# Patient Record
Sex: Female | Born: 1940 | Race: White | Hispanic: No | Smoking: Never smoker
Health system: Southern US, Community
[De-identification: ages and names within clinical notes are randomized; demographics above are authoritative.]

## PROBLEM LIST (undated history)

## (undated) DIAGNOSIS — B029 Zoster without complications: Secondary | ICD-10-CM

## (undated) DIAGNOSIS — I8390 Asymptomatic varicose veins of unspecified lower extremity: Secondary | ICD-10-CM

## (undated) DIAGNOSIS — D649 Anemia, unspecified: Secondary | ICD-10-CM

## (undated) DIAGNOSIS — IMO0002 Reserved for concepts with insufficient information to code with codable children: Secondary | ICD-10-CM

## (undated) HISTORY — DX: Reserved for concepts with insufficient information to code with codable children: IMO0002

## (undated) HISTORY — PX: MOUTH SURGERY: SHX715

## (undated) HISTORY — PX: BREAST BIOPSY: SHX20

## (undated) HISTORY — PX: APPENDECTOMY: SHX54

## (undated) HISTORY — PX: ABDOMINAL HYSTERECTOMY: SHX81

## (undated) HISTORY — DX: Asymptomatic varicose veins of unspecified lower extremity: I83.90

## (undated) HISTORY — DX: Anemia, unspecified: D64.9

## (undated) HISTORY — DX: Zoster without complications: B02.9

---

## 1999-02-16 ENCOUNTER — Ambulatory Visit (HOSPITAL_COMMUNITY): Admission: RE | Admit: 1999-02-16 | Discharge: 1999-02-16 | Payer: Self-pay | Admitting: General Surgery

## 1999-02-16 ENCOUNTER — Encounter: Payer: Self-pay | Admitting: General Surgery

## 1999-09-28 ENCOUNTER — Encounter: Admission: RE | Admit: 1999-09-28 | Discharge: 1999-09-28 | Payer: Self-pay | Admitting: General Surgery

## 2005-11-19 ENCOUNTER — Emergency Department (HOSPITAL_COMMUNITY): Admission: EM | Admit: 2005-11-19 | Discharge: 2005-11-19 | Payer: Self-pay | Admitting: Emergency Medicine

## 2007-05-16 ENCOUNTER — Encounter: Admission: RE | Admit: 2007-05-16 | Discharge: 2007-05-16 | Payer: Self-pay | Admitting: Internal Medicine

## 2008-05-21 ENCOUNTER — Encounter: Admission: RE | Admit: 2008-05-21 | Discharge: 2008-05-21 | Payer: Self-pay | Admitting: Internal Medicine

## 2009-05-12 ENCOUNTER — Observation Stay (HOSPITAL_COMMUNITY): Admission: EM | Admit: 2009-05-12 | Discharge: 2009-05-12 | Payer: Self-pay | Admitting: Emergency Medicine

## 2009-06-25 ENCOUNTER — Encounter: Admission: RE | Admit: 2009-06-25 | Discharge: 2009-06-25 | Payer: Self-pay | Admitting: Internal Medicine

## 2010-06-29 ENCOUNTER — Encounter: Admission: RE | Admit: 2010-06-29 | Discharge: 2010-06-29 | Payer: Self-pay | Admitting: Internal Medicine

## 2011-03-23 LAB — POCT I-STAT, CHEM 8
Calcium, Ion: 1.07 mmol/L — ABNORMAL LOW (ref 1.12–1.32)
Creatinine, Ser: 0.8 mg/dL (ref 0.4–1.2)
Glucose, Bld: 161 mg/dL — ABNORMAL HIGH (ref 70–99)
HCT: 38 % (ref 36.0–46.0)
Hemoglobin: 12.9 g/dL (ref 12.0–15.0)

## 2011-03-23 LAB — HEPATIC FUNCTION PANEL
ALT: 25 U/L (ref 0–35)
AST: 34 U/L (ref 0–37)
Bilirubin, Direct: 0.2 mg/dL (ref 0.0–0.3)
Indirect Bilirubin: 0.6 mg/dL (ref 0.3–0.9)
Total Bilirubin: 0.8 mg/dL (ref 0.3–1.2)

## 2011-07-01 ENCOUNTER — Other Ambulatory Visit: Payer: Self-pay | Admitting: Internal Medicine

## 2011-07-01 DIAGNOSIS — Z1231 Encounter for screening mammogram for malignant neoplasm of breast: Secondary | ICD-10-CM

## 2011-07-28 ENCOUNTER — Ambulatory Visit
Admission: RE | Admit: 2011-07-28 | Discharge: 2011-07-28 | Disposition: A | Discharge status: Home / Self Care | Payer: Medicare Other | Source: Ambulatory Visit | Attending: Internal Medicine | Admitting: Internal Medicine

## 2011-07-28 DIAGNOSIS — Z1231 Encounter for screening mammogram for malignant neoplasm of breast: Secondary | ICD-10-CM

## 2011-10-07 ENCOUNTER — Other Ambulatory Visit: Payer: Self-pay | Admitting: Internal Medicine

## 2011-10-07 DIAGNOSIS — M8580 Other specified disorders of bone density and structure, unspecified site: Secondary | ICD-10-CM

## 2011-10-20 ENCOUNTER — Ambulatory Visit
Admission: RE | Admit: 2011-10-20 | Discharge: 2011-10-20 | Disposition: A | Discharge status: Home / Self Care | Payer: Medicare Other | Source: Ambulatory Visit | Attending: Internal Medicine | Admitting: Internal Medicine

## 2011-10-20 DIAGNOSIS — M8580 Other specified disorders of bone density and structure, unspecified site: Secondary | ICD-10-CM

## 2012-07-21 ENCOUNTER — Other Ambulatory Visit: Payer: Self-pay | Admitting: Internal Medicine

## 2012-07-21 DIAGNOSIS — Z1231 Encounter for screening mammogram for malignant neoplasm of breast: Secondary | ICD-10-CM

## 2012-08-15 ENCOUNTER — Ambulatory Visit
Admission: RE | Admit: 2012-08-15 | Discharge: 2012-08-15 | Disposition: A | Discharge status: Home / Self Care | Payer: Medicare Other | Source: Ambulatory Visit | Attending: Internal Medicine | Admitting: Internal Medicine

## 2012-08-15 DIAGNOSIS — Z1231 Encounter for screening mammogram for malignant neoplasm of breast: Secondary | ICD-10-CM

## 2012-08-17 ENCOUNTER — Other Ambulatory Visit: Payer: Self-pay | Admitting: Internal Medicine

## 2012-08-17 DIAGNOSIS — R928 Other abnormal and inconclusive findings on diagnostic imaging of breast: Secondary | ICD-10-CM

## 2012-08-23 ENCOUNTER — Ambulatory Visit
Admission: RE | Admit: 2012-08-23 | Discharge: 2012-08-23 | Disposition: A | Discharge status: Home / Self Care | Payer: Medicare Other | Source: Ambulatory Visit | Attending: Internal Medicine | Admitting: Internal Medicine

## 2012-08-23 ENCOUNTER — Other Ambulatory Visit: Payer: Self-pay | Admitting: Internal Medicine

## 2012-08-23 DIAGNOSIS — R928 Other abnormal and inconclusive findings on diagnostic imaging of breast: Secondary | ICD-10-CM

## 2012-08-23 DIAGNOSIS — R921 Mammographic calcification found on diagnostic imaging of breast: Secondary | ICD-10-CM

## 2012-09-04 ENCOUNTER — Other Ambulatory Visit: Payer: Self-pay | Admitting: Internal Medicine

## 2012-09-04 ENCOUNTER — Ambulatory Visit
Admission: RE | Admit: 2012-09-04 | Discharge: 2012-09-04 | Disposition: A | Discharge status: Home / Self Care | Payer: Medicare Other | Source: Ambulatory Visit | Attending: Internal Medicine | Admitting: Internal Medicine

## 2012-09-04 DIAGNOSIS — R921 Mammographic calcification found on diagnostic imaging of breast: Secondary | ICD-10-CM

## 2013-11-02 ENCOUNTER — Other Ambulatory Visit: Payer: Self-pay | Admitting: Vascular Surgery

## 2013-11-02 DIAGNOSIS — L97909 Non-pressure chronic ulcer of unspecified part of unspecified lower leg with unspecified severity: Secondary | ICD-10-CM

## 2013-12-03 ENCOUNTER — Encounter: Payer: Self-pay | Admitting: Vascular Surgery

## 2013-12-04 ENCOUNTER — Encounter: Payer: Self-pay | Admitting: Vascular Surgery

## 2013-12-04 ENCOUNTER — Ambulatory Visit (HOSPITAL_COMMUNITY)
Admission: RE | Admit: 2013-12-04 | Discharge: 2013-12-04 | Disposition: A | Discharge status: Home / Self Care | Payer: Medicare Other | Source: Ambulatory Visit | Attending: Vascular Surgery | Admitting: Vascular Surgery

## 2013-12-04 ENCOUNTER — Other Ambulatory Visit: Payer: Self-pay | Admitting: Vascular Surgery

## 2013-12-04 ENCOUNTER — Ambulatory Visit (INDEPENDENT_AMBULATORY_CARE_PROVIDER_SITE_OTHER): Payer: Self-pay | Admitting: Vascular Surgery

## 2013-12-04 VITALS — BP 170/81 | HR 71 | Ht 62.5 in | Wt 175.0 lb

## 2013-12-04 DIAGNOSIS — L97909 Non-pressure chronic ulcer of unspecified part of unspecified lower leg with unspecified severity: Secondary | ICD-10-CM

## 2013-12-04 DIAGNOSIS — I8390 Asymptomatic varicose veins of unspecified lower extremity: Secondary | ICD-10-CM | POA: Insufficient documentation

## 2013-12-04 DIAGNOSIS — IMO0001 Reserved for inherently not codable concepts without codable children: Secondary | ICD-10-CM

## 2013-12-04 DIAGNOSIS — I83009 Varicose veins of unspecified lower extremity with ulcer of unspecified site: Secondary | ICD-10-CM | POA: Insufficient documentation

## 2013-12-04 NOTE — Progress Notes (Signed)
Patient name: Carmen Delgado MRN: [id number] DOB: 1941-02-04 Sex: female   Referred by: Ludwig Clarks  Reason for referral:  Chief Complaint  Patient presents with  . New Evaluation    R leg cellulitis/ venous stasis ulcer     HISTORY OF PRESENT ILLNESS: Patient is a 72 year old white female with long history of severe venous hypertension. He has had extensive changes of the stasis disease for many years to include extensive hemosiderin deposits and varicosities. She's had multiple prior superficial bleeds from the telangiectasia over her lower extremities below the knees as well. She had an episode of ulceration over the right pretibial area several months ago and this is been progressive since that time. She's had a great deal of induration and severe pain associated with this. She has had inability to allow any pressure over this reports that she attempt to keep this open tears much as possible to make it feel better. She does not have any history of DVT. She does have a very strong family history of extensive venous pathology especially in her mother. She has never had any venous stasis ulcers on her left leg and this is the first venous ulcer on her right leg. He stands for a prolonged period of time and she works as a Copy.  Past Medical History  Diagnosis Date  . Anemia     Past Surgical History  Procedure Laterality Date  . Abdominal hysterectomy    . Appendectomy    . Mouth surgery      fatty tumor removed    History   Social History  . Marital Status: Divorced    Spouse Name: N/A    Number of Children: N/A  . Years of Education: N/A   Occupational History  . Not on file.   Social History Main Topics  . Smoking status: Former Smoker    Types: Cigarettes  . Smokeless tobacco: Not on file     Comment: smoked cigs for a short time in 20's   . Alcohol Use: No  . Drug Use: No  . Sexual Activity: Not on file   Other Topics Concern  . Not on  file   Social History Narrative  . No narrative on file    Family History  Problem Relation Age of Onset  . Diabetes Mother   . Heart disease Mother     before age 76  . Hypertension Mother   . Varicose Veins Mother   . Peripheral vascular disease Mother   . Heart disease Father     before age 72  . Hypertension Father   . Cancer Sister   . Diabetes Sister   . Hypertension Sister   . Heart disease Sister     before age 49  . Diabetes Brother   . Hypertension Brother   . Diabetes Daughter     Allergies as of 12/04/2013 - Review Complete 12/04/2013  Allergen Reaction Noted  . Codeine Nausea And Vomiting 12/04/2013  . Sulfa antibiotics Nausea And Vomiting 12/04/2013  . Garlic Rash 12/04/2013    No current outpatient prescriptions on file prior to visit.   No current facility-administered medications on file prior to visit.     REVIEW OF SYSTEMS:  Positives indicated with an "X"  CARDIOVASCULAR:  [ ]  chest pain   [ ]  chest pressure   [ ]  palpitations   [ ]  orthopnea   [ ]  dyspnea on exertion   [ ]  claudication   [ ]   rest pain   [ ]  DVT   [ ]  phlebitis PULMONARY:   [ ]  productive cough   [ ]  asthma   [ ]  wheezing NEUROLOGIC:   [ ]  weakness  [ ]  paresthesias  [ ]  aphasia  [ ]  amaurosis  [ ]  dizziness HEMATOLOGIC:   [ ]  bleeding problems   [ ]  clotting disorders MUSCULOSKELETAL:  [ ]  joint pain   [ ]  joint swelling GASTROINTESTINAL: [ ]   blood in stool  [ ]   hematemesis GENITOURINARY:  [ ]   dysuria  [ ]   hematuria PSYCHIATRIC:  [ ]  history of major depression INTEGUMENTARY:  [ ]  rashes  [ ]  ulcers CONSTITUTIONAL:  [ ]  fever   [ ]  chills  PHYSICAL EXAMINATION:  General: The patient is a well-nourished female, in no acute distress. Vital signs are BP 170/81  Pulse 71  Ht 5' 2.5" (1.588 m)  Wt 175 lb (79.379 kg)  BMI 31.48 kg/m2  SpO2 100% Pulmonary: There is a good air exchange bilaterally without wheezing or rales. Abdomen: Soft and non-tender with normal  pitch bowel sounds. Musculoskeletal: There are no major deformities.   Neurologic: No focal weakness or paresthesias are detected, Skin: Extensive changes of venous hypertension bilaterally with circumferential hemosiderin deposits from just below her knees onto her ankle. She has extensive telangiectasia over both feet as well. She does have a 2 cm open venous ulcer over the Psychiatric: The patient has normal affect. Cardiovascular: There is a regular rate and rhythm without significant murmur appreciated. She has 2+ dorsalis pedis pulses bilaterally  [facility] Vascular Lab Studies:  Ordered and Independently Reviewed this reveals significant reflux in both great saphenous veins with dilated saphenous vein bilaterally. She does have deep venous reflux as well.  Impression and Plan:  Severe bilateral venous reflux with right leg venous stasis ulcer. I had discussed the critical importance of compression and elevation with the patient. He has a very difficult time with this the 2 pain. I do feel that she benefit with ablation of her great saphenous vein bilaterally. I feel this would have a very good chance of improving her healing for her very painful venous ulcer. We have instructed her on dressing and a venous ulcer with Silvadene and a Ace wrap for compression. I would recommend proceeding with right leg ablation to improve her chances for healing. We fitted her for bilateral thigh-high graduated compression garments, 20-30 mm mercury today. I would recommend treatment of her right great saphenous vein now versus waiting for a 3 month trial due to her severe pain and open venous ulcer the    EARLY, TODD [facility] Office: [phone]

## 2013-12-05 ENCOUNTER — Encounter: Payer: Self-pay | Admitting: Internal Medicine

## 2013-12-07 ENCOUNTER — Encounter: Payer: Medicare Other | Admitting: Vascular Surgery

## 2013-12-07 ENCOUNTER — Other Ambulatory Visit (HOSPITAL_COMMUNITY): Payer: Medicare Other

## 2013-12-21 ENCOUNTER — Other Ambulatory Visit: Payer: Self-pay | Admitting: *Deleted

## 2013-12-21 DIAGNOSIS — L97919 Non-pressure chronic ulcer of unspecified part of right lower leg with unspecified severity: Principal | ICD-10-CM

## 2013-12-21 DIAGNOSIS — I83019 Varicose veins of right lower extremity with ulcer of unspecified site: Secondary | ICD-10-CM

## 2013-12-27 ENCOUNTER — Other Ambulatory Visit: Payer: Self-pay | Admitting: Vascular Surgery

## 2013-12-27 DIAGNOSIS — I83019 Varicose veins of right lower extremity with ulcer of unspecified site: Secondary | ICD-10-CM

## 2013-12-27 DIAGNOSIS — L97919 Non-pressure chronic ulcer of unspecified part of right lower leg with unspecified severity: Principal | ICD-10-CM

## 2014-01-16 ENCOUNTER — Encounter: Payer: Self-pay | Admitting: Vascular Surgery

## 2014-01-17 ENCOUNTER — Encounter: Payer: Self-pay | Admitting: Vascular Surgery

## 2014-01-17 ENCOUNTER — Ambulatory Visit (INDEPENDENT_AMBULATORY_CARE_PROVIDER_SITE_OTHER): Payer: Medicare Other | Admitting: Vascular Surgery

## 2014-01-17 VITALS — BP 115/73 | HR 102 | Resp 18 | Ht 62.0 in | Wt 180.0 lb

## 2014-01-17 DIAGNOSIS — L97909 Non-pressure chronic ulcer of unspecified part of unspecified lower leg with unspecified severity: Principal | ICD-10-CM

## 2014-01-17 DIAGNOSIS — I83009 Varicose veins of unspecified lower extremity with ulcer of unspecified site: Secondary | ICD-10-CM | POA: Insufficient documentation

## 2014-01-17 HISTORY — PX: ENDOVENOUS ABLATION SAPHENOUS VEIN W/ LASER: SUR449

## 2014-01-17 NOTE — Progress Notes (Signed)
   Laser Ablation Procedure      Date: 01/17/2014    Burr Medicolender F Ide DOB:10/19/1941  Consent signed: Yes  Surgeon:T.F. Caprina Wussow  Procedure: Laser Ablation: right Greater Saphenous Vein  BP 115/73  Pulse 102  Resp 18  Ht 5\' 2"  (1.575 m)  Wt 180 lb (81.647 kg)  BMI 32.91 kg/m2  Start time: 8:30am   End time: 9:50am  Tumescent Anesthesia: 475 cc 0.9% NaCl with 50 cc Lidocaine HCL with 1% Epi and 15 cc 8.4% NaHCO3  Local Anesthesia: 4.5 cc Lidocaine HCL and NaHCO3 (ratio 2:1)  Continuous Mode: 15 Watts Total Energy 2386 Joules Total Time2:38   Sclerotherapy: 0.3 %Sotradecol. Patient received a total of 3 cc  Right leg    Patient tolerated procedure well: Yes   Description of Procedure:  After marking the course of the saphenous vein and the secondary varicosities in the standing position, the patient was placed on the operating table in the supine position, and the right leg was prepped and draped in sterile fashion. Local anesthetic was administered, and under ultrasound guidance the saphenous vein was accessed with a micro needle and guide wire; then the micro puncture sheath was placed. A guide wire was inserted to the saphenofemoral junction, followed by a 5 french sheath.  The position of the sheath and then the laser fiber below the junction was confirmed using the ultrasound and visualization of the aiming beam.  Tumescent anesthesia was administered along the course of the saphenous vein using ultrasound guidance. Protective laser glasses were placed on the patient, and the laser was fired at at 15 watt continuous mode.  For a total of 2386 joules.  A steri strip was applied to the puncture site.    Sclerotherapy was performed to 3 vessels using 3  cc .3% Sotradecol foam via a 30 gauge needle.  ABD pads and thigh high compression stockings were applied.  Ace wrap bandages were applied  at the top of the saphenofemoral junction.  Blood loss was less than 15 cc.  The patient  ambulated out of the operating room having tolerated the procedure well.

## 2014-01-18 ENCOUNTER — Telehealth: Payer: Self-pay | Admitting: *Deleted

## 2014-01-18 ENCOUNTER — Encounter: Payer: Self-pay | Admitting: Vascular Surgery

## 2014-01-18 NOTE — Telephone Encounter (Signed)
    01/18/2014  Time: 11:43 AM   Patient Name: Carmen Delgado  Patient of: T.F. Early  Procedure:Laser Ablation right greater saphenous vein and sclerotherapy right leg 01-17-2014  Reached patient at home and checked  Her status  Yes    Comments/Actions Taken: Ms. Larey BrickWinfree states she has generalized right leg pain which she describes as "dull, achy" but no right leg swelling.  She states that she is taking Ibuprofen as instructed and keeping her right leg elevated and using ice compress to right leg prn pain.  Reviewed post procedural instructions with her and reminded her of post laser ablation duplex and VV FU with Dr. Arbie CookeyEarly on 01-22-2014.     @SIGNATURE @

## 2014-01-21 ENCOUNTER — Encounter: Payer: Self-pay | Admitting: Vascular Surgery

## 2014-01-22 ENCOUNTER — Encounter: Payer: Self-pay | Admitting: Vascular Surgery

## 2014-01-22 ENCOUNTER — Ambulatory Visit (HOSPITAL_COMMUNITY)
Admission: RE | Admit: 2014-01-22 | Discharge: 2014-01-22 | Disposition: A | Discharge status: Home / Self Care | Payer: Medicare Other | Source: Ambulatory Visit | Attending: Vascular Surgery | Admitting: Vascular Surgery

## 2014-01-22 ENCOUNTER — Ambulatory Visit (INDEPENDENT_AMBULATORY_CARE_PROVIDER_SITE_OTHER): Payer: Medicare Other | Admitting: Vascular Surgery

## 2014-01-22 VITALS — BP 140/79 | HR 91 | Resp 18 | Ht 62.0 in | Wt 179.3 lb

## 2014-01-22 DIAGNOSIS — I83009 Varicose veins of unspecified lower extremity with ulcer of unspecified site: Secondary | ICD-10-CM

## 2014-01-22 DIAGNOSIS — L97919 Non-pressure chronic ulcer of unspecified part of right lower leg with unspecified severity: Secondary | ICD-10-CM

## 2014-01-22 DIAGNOSIS — L97909 Non-pressure chronic ulcer of unspecified part of unspecified lower leg with unspecified severity: Principal | ICD-10-CM

## 2014-01-22 DIAGNOSIS — I83019 Varicose veins of right lower extremity with ulcer of unspecified site: Secondary | ICD-10-CM

## 2014-01-22 DIAGNOSIS — Z09 Encounter for follow-up examination after completed treatment for conditions other than malignant neoplasm: Secondary | ICD-10-CM | POA: Insufficient documentation

## 2014-01-22 NOTE — Progress Notes (Signed)
Here today for followup of her laser ablation of right great saphenous vein one week ago. She had minimal discomfort associated with this. He fortunately persistent soreness around her ankle where she had an extensive venous ulcer. She is mild soreness in her medial thigh at the ablation site.  Past Medical History  Diagnosis Date  . Anemia   . Ulcer   . Varicose veins     History  Substance Use Topics  . Smoking status: Never Smoker   . Smokeless tobacco: Not on file  . Alcohol Use: No    Family History  Problem Relation Age of Onset  . Diabetes Mother   . Heart disease Mother     before age 660  . Hypertension Mother   . Varicose Veins Mother   . Peripheral vascular disease Mother   . Heart disease Father     before age 74  . Hypertension Father   . Cancer Sister   . Diabetes Sister   . Hypertension Sister   . Heart disease Sister     before age 74  . Diabetes Brother   . Hypertension Brother   . Diabetes Daughter     Allergies  Allergen Reactions  . Codeine Nausea And Vomiting  . Sulfa Antibiotics Nausea And Vomiting  . Garlic Rash    Current outpatient prescriptions:Calcium Citrate (CITRACAL PO), Take 1 capsule by mouth daily., Disp: , Rfl: ;  Cholecalciferol (VITAMIN D3) 1000 UNITS CAPS, Take 1 capsule by mouth daily., Disp: , Rfl: ;  levothyroxine (SYNTHROID, LEVOTHROID) 50 MCG tablet, Take 50 mcg by mouth daily before breakfast., Disp: , Rfl: ;  lisinopril-hydrochlorothiazide (PRINZIDE,ZESTORETIC) 20-25 MG per tablet, Take 1 tablet by mouth daily., Disp: , Rfl:  magnesium gluconate (MAGONATE) 500 MG tablet, Take 500 mg by mouth 2 (two) times daily., Disp: , Rfl: ;  Neomycin-Bacitracin-Polymyxin (NEOSPORIN EX), Apply 1 application topically as needed., Disp: , Rfl: ;  pravastatin (PRAVACHOL) 40 MG tablet, Take 40 mg by mouth daily., Disp: , Rfl:   BP 140/79  Pulse 91  Resp 18  Ht 5\' 2"  (1.575 m)  Wt 179 lb 4.8 oz (81.33 kg)  BMI 32.79 kg/m2  Body mass index  is 32.79 kg/(m^2).       On physical exam she does have some mild bruising to her right medial thigh. She does not have any evidence of superficial thrombophlebitis. The area of sclerotherapy of the telangiectasia is all healing with typical darkening of the telangiectasia.  Venous duplex today was reviewed with the patient. This shows closure of her saphenous vein from the insertion site just below the knee to just below the saphenofemoral junction. There is no evidence of DVT.  Impression and plan: Treatment of venous hypertension related to the surface vein the right leg. She will continue her compression garment used for one additional week and then as needed. We will see her back for a similar treatment of her ablation of her left great saphenous vein in 2 weeks.

## 2014-02-06 ENCOUNTER — Encounter: Payer: Self-pay | Admitting: Vascular Surgery

## 2014-02-07 ENCOUNTER — Other Ambulatory Visit: Payer: Medicare Other | Admitting: Vascular Surgery

## 2014-02-12 ENCOUNTER — Ambulatory Visit: Payer: Medicare Other | Admitting: Vascular Surgery

## 2014-02-12 ENCOUNTER — Encounter (HOSPITAL_COMMUNITY): Payer: Medicare Other

## 2014-02-13 ENCOUNTER — Other Ambulatory Visit: Payer: Self-pay | Admitting: *Deleted

## 2014-02-13 DIAGNOSIS — M79609 Pain in unspecified limb: Secondary | ICD-10-CM

## 2014-02-13 DIAGNOSIS — I83893 Varicose veins of bilateral lower extremities with other complications: Secondary | ICD-10-CM

## 2014-03-07 ENCOUNTER — Encounter: Payer: Self-pay | Admitting: Vascular Surgery

## 2014-03-08 ENCOUNTER — Encounter: Payer: Self-pay | Admitting: Vascular Surgery

## 2014-03-08 ENCOUNTER — Ambulatory Visit (INDEPENDENT_AMBULATORY_CARE_PROVIDER_SITE_OTHER): Payer: Self-pay | Admitting: Vascular Surgery

## 2014-03-08 VITALS — BP 149/75 | HR 76 | Resp 18 | Ht 62.0 in | Wt 178.0 lb

## 2014-03-08 DIAGNOSIS — I83893 Varicose veins of bilateral lower extremities with other complications: Secondary | ICD-10-CM

## 2014-03-08 HISTORY — PX: ENDOVENOUS ABLATION SAPHENOUS VEIN W/ LASER: SUR449

## 2014-03-08 NOTE — Progress Notes (Signed)
   Laser Ablation Procedure      Date: 03/08/2014    Burr Medicolender F Salazar DOB:09-Mar-1941  Consent signed: Yes  Surgeon:T.F. Cherlynn Popiel  Procedure: Laser Ablation: left Greater Saphenous Vein  BP 149/75  Pulse 76  Resp 18  Ht 5\' 2"  (1.575 m)  Wt 178 lb (80.74 kg)  BMI 32.55 kg/m2  Start time: 10:30 AM   End time: 11:45 AM  Tumescent Anesthesia: 425 cc 0.9% NaCl with 50 cc Lidocaine HCL with 1% Epi and 15 cc 8.4% NaHCO3  Local Anesthesia: 4 cc Lidocaine HCL and NaHCO3 (ratio 2:1)  Continuous Mode: 15 Watts Total Energy 2383 Joules Total Time2:39   Sclerotherapy: 0.3 %Sotradecol. Patient received a total of 3 cc   Left leg    Patient tolerated procedure well: Yes   Description of Procedure:  After marking the course of the saphenous vein and the secondary varicosities in the standing position, the patient was placed on the operating table in the supine position, and the left leg was prepped and draped in sterile fashion. Local anesthetic was administered, and under ultrasound guidance the saphenous vein was accessed with a micro needle and guide wire; then the micro puncture sheath was placed. A guide wire was inserted to the saphenofemoral junction, followed by a 5 french sheath.  The position of the sheath and then the laser fiber below the junction was confirmed using the ultrasound and visualization of the aiming beam.  Tumescent anesthesia was administered along the course of the saphenous vein using ultrasound guidance. Protective laser glasses were placed on the patient, and the laser was fired at at 15 watt continuous mode.  For a total of 2383 joules.  A steri strip was applied to the puncture site.   Sclerotherapy was performed to 2 vessels using 3  cc .3% Sotradecol foam via a 30 gauge needle.  ABD pads and thigh high compression stockings were applied.  Ace wrap bandages were applied over the phlebectomy sites and at the top of the saphenofemoral junction.  Blood loss was less  than 15 cc.  The patient ambulated out of the operating room having tolerated the procedure well.

## 2014-03-11 ENCOUNTER — Telehealth: Payer: Self-pay | Admitting: *Deleted

## 2014-03-11 ENCOUNTER — Encounter: Payer: Self-pay | Admitting: Vascular Surgery

## 2014-03-11 NOTE — Telephone Encounter (Signed)
    03/11/2014  Time: 9:04 AM   Patient Name: Carmen Delgado  Patient of: T.F. Early  Procedure:Laser Ablation and Sclerotherapy left leg 03-08-2014   (left greater saphenous vein treated with laser ablation)  Reached patient at home and checked  Her status  Yes    Comments/Actions Taken: Carmen Delgado states she is having mild discomfort around the left ankle (sclerotherapy site) and she is using ice compress which is helping to alleviate the discomfort.  She states no leg swelling or bleeding/oozing.  Reviewed post procedural instructions with her and reminded her of post LA duplex and VV FU with Dr. Arbie CookeyEarly on [id number].      @SIGNATURE @

## 2014-03-18 ENCOUNTER — Encounter: Payer: Self-pay | Admitting: Vascular Surgery

## 2014-03-19 ENCOUNTER — Ambulatory Visit (HOSPITAL_COMMUNITY)
Admission: RE | Admit: 2014-03-19 | Discharge: 2014-03-19 | Disposition: A | Discharge status: Home / Self Care | Payer: Medicare Other | Source: Ambulatory Visit | Attending: Vascular Surgery | Admitting: Vascular Surgery

## 2014-03-19 ENCOUNTER — Encounter: Payer: Self-pay | Admitting: Vascular Surgery

## 2014-03-19 ENCOUNTER — Ambulatory Visit (INDEPENDENT_AMBULATORY_CARE_PROVIDER_SITE_OTHER): Payer: Medicare Other | Admitting: Vascular Surgery

## 2014-03-19 ENCOUNTER — Encounter (HOSPITAL_COMMUNITY): Payer: Medicare Other

## 2014-03-19 ENCOUNTER — Ambulatory Visit: Payer: Medicare Other | Admitting: Vascular Surgery

## 2014-03-19 VITALS — BP 153/83 | HR 102 | Resp 16 | Ht 62.5 in | Wt 178.0 lb

## 2014-03-19 DIAGNOSIS — I83893 Varicose veins of bilateral lower extremities with other complications: Secondary | ICD-10-CM

## 2014-03-19 DIAGNOSIS — M79609 Pain in unspecified limb: Secondary | ICD-10-CM | POA: Insufficient documentation

## 2014-03-19 NOTE — Progress Notes (Signed)
Today for followup of her staged bilateral great saphenous vein ablation and also sclerotherapy of telangiectasias which it had bleeding in the past. She does report more discomfort in her left leg was particularly in the distal thigh and popliteal area. Also has some soreness in one of the injection sites on the medial malleolus area.  On physical exam she does have some thickness of her saphenous vein is anticipated and no bruising. She does have some superficial skin irritation at the level of the medial malleolus where she had sclerotherapy. There several layers of sclerotherapy over the lateral distal calf in the pretibial region these have very superficial breakdown and clot in the telangiectasia.  Duplex of her left leg today reveals closure of her great saphenous vein from her mid calf up to just below the saphenofemoral junction and no evidence of DVT  Impression and plan schedule bilateral staged ablation of her great saphenous vein. She will continue her compression garment for one additional week and thigh and then dropped back to knee-high compression garments. She has been placing Neosporin we'll alternate with Silvadene over the superficial excoriation in her medial ankle and the left. She will see us again on an as-needed basis

## 2015-06-02 ENCOUNTER — Other Ambulatory Visit: Payer: Self-pay | Admitting: Internal Medicine

## 2015-06-02 ENCOUNTER — Ambulatory Visit
Admission: RE | Admit: 2015-06-02 | Discharge: 2015-06-02 | Disposition: A | Discharge status: Home / Self Care | Payer: Medicare Other | Source: Ambulatory Visit | Attending: Internal Medicine | Admitting: Internal Medicine

## 2015-06-02 DIAGNOSIS — R079 Chest pain, unspecified: Secondary | ICD-10-CM

## 2015-06-09 ENCOUNTER — Other Ambulatory Visit: Payer: Self-pay | Admitting: Internal Medicine

## 2015-06-09 DIAGNOSIS — Z1231 Encounter for screening mammogram for malignant neoplasm of breast: Secondary | ICD-10-CM

## 2015-06-09 DIAGNOSIS — M8580 Other specified disorders of bone density and structure, unspecified site: Secondary | ICD-10-CM

## 2015-06-18 ENCOUNTER — Ambulatory Visit
Admission: RE | Admit: 2015-06-18 | Discharge: 2015-06-18 | Disposition: A | Discharge status: Home / Self Care | Payer: Medicare Other | Source: Ambulatory Visit | Attending: Internal Medicine | Admitting: Internal Medicine

## 2015-06-18 DIAGNOSIS — Z1231 Encounter for screening mammogram for malignant neoplasm of breast: Secondary | ICD-10-CM

## 2015-06-18 DIAGNOSIS — M8580 Other specified disorders of bone density and structure, unspecified site: Secondary | ICD-10-CM

## 2018-03-07 ENCOUNTER — Other Ambulatory Visit: Payer: Self-pay | Admitting: Internal Medicine

## 2018-03-07 DIAGNOSIS — Z1231 Encounter for screening mammogram for malignant neoplasm of breast: Secondary | ICD-10-CM

## 2018-04-10 ENCOUNTER — Ambulatory Visit: Payer: Medicare Other

## 2018-04-28 ENCOUNTER — Ambulatory Visit
Admission: RE | Admit: 2018-04-28 | Discharge: 2018-04-28 | Disposition: A | Discharge status: Home / Self Care | Payer: Medicare Other | Source: Ambulatory Visit | Attending: Internal Medicine | Admitting: Internal Medicine

## 2018-04-28 DIAGNOSIS — Z1231 Encounter for screening mammogram for malignant neoplasm of breast: Secondary | ICD-10-CM

## 2020-05-09 ENCOUNTER — Other Ambulatory Visit: Payer: Self-pay | Admitting: Internal Medicine

## 2020-05-09 DIAGNOSIS — Z1231 Encounter for screening mammogram for malignant neoplasm of breast: Secondary | ICD-10-CM

## 2020-05-09 DIAGNOSIS — M8580 Other specified disorders of bone density and structure, unspecified site: Secondary | ICD-10-CM

## 2020-07-29 ENCOUNTER — Ambulatory Visit
Admission: RE | Admit: 2020-07-29 | Discharge: 2020-07-29 | Disposition: A | Discharge status: Home / Self Care | Payer: Medicare Other | Source: Ambulatory Visit | Attending: Internal Medicine | Admitting: Internal Medicine

## 2020-07-29 ENCOUNTER — Other Ambulatory Visit: Payer: Self-pay

## 2020-07-29 DIAGNOSIS — M8580 Other specified disorders of bone density and structure, unspecified site: Secondary | ICD-10-CM

## 2020-07-29 DIAGNOSIS — Z1231 Encounter for screening mammogram for malignant neoplasm of breast: Secondary | ICD-10-CM

## 2022-07-18 IMAGING — MG DIGITAL SCREENING BILAT W/ TOMO W/ CAD
8 series · 8 of 24 positions shown · non-contrast
Comparison: Previous exam(s).

CLINICAL DATA: Screening.

EXAM:
DIGITAL SCREENING BILATERAL MAMMOGRAM WITH TOMO AND CAD

[L CC synth-2D]
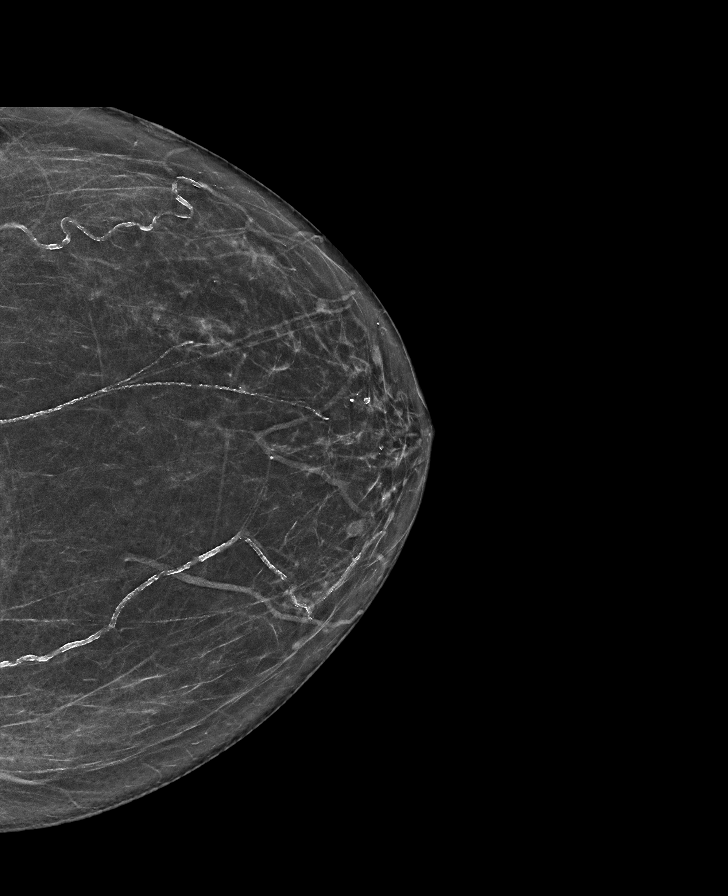

[R CC synth-2D]
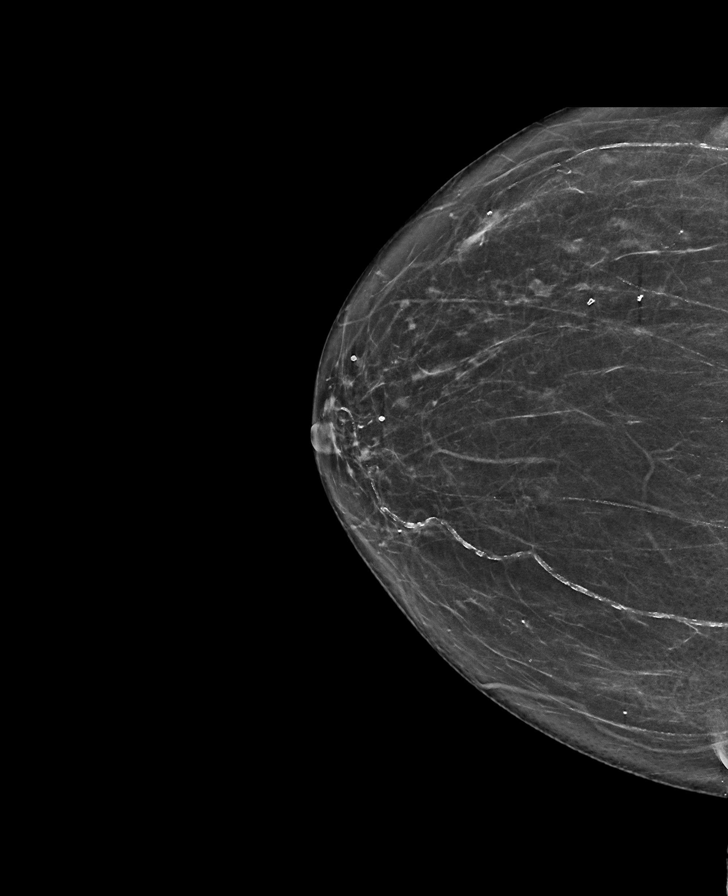

[R MLO synth-2D]
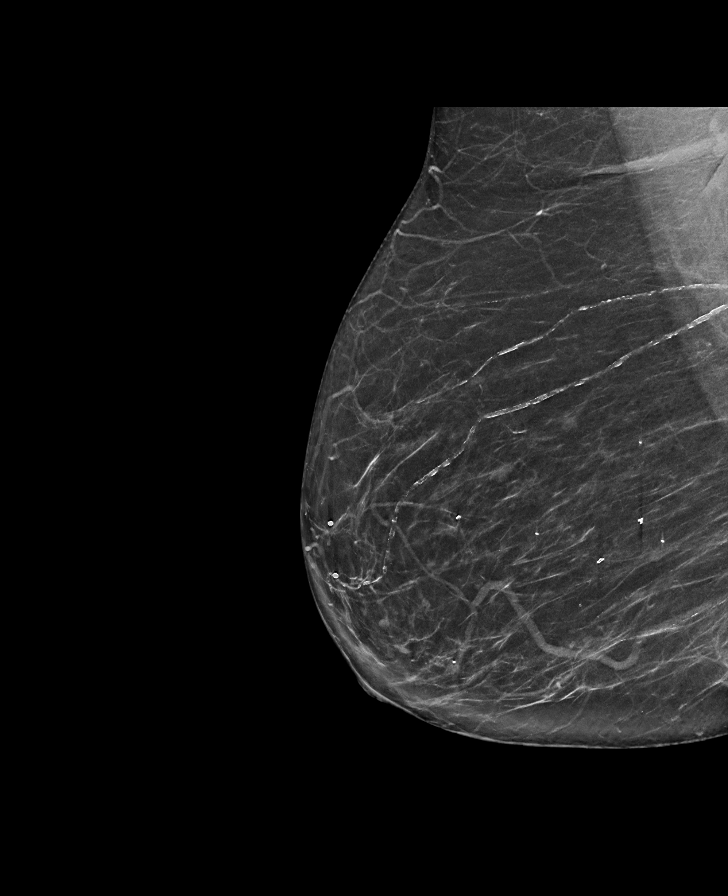

[L MLO synth-2D]
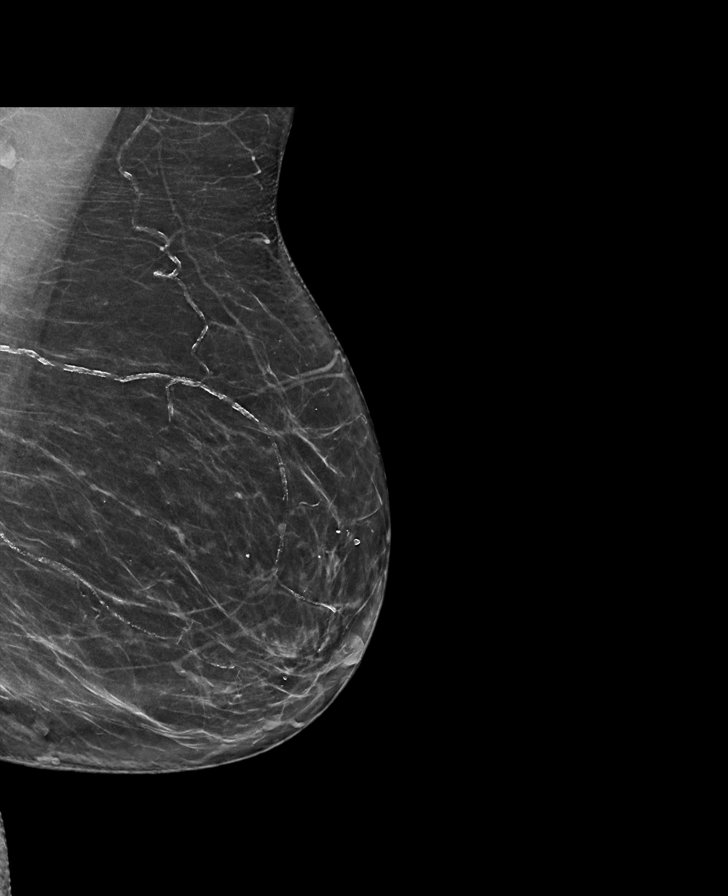

[L MLO tomo · tomo slice 39/76.0]
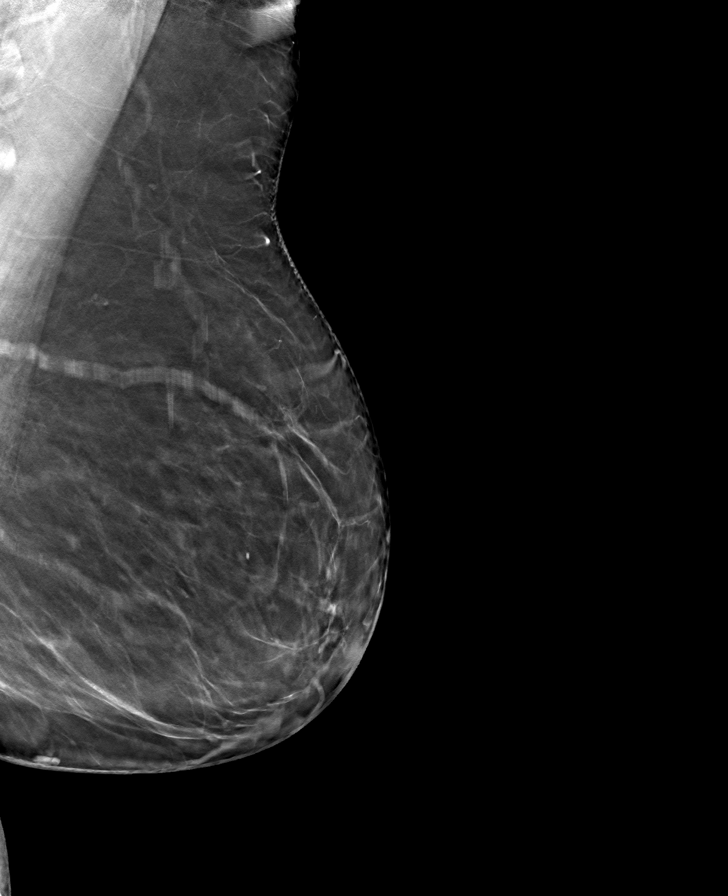

[R CC tomo · tomo slice 33/66.0]
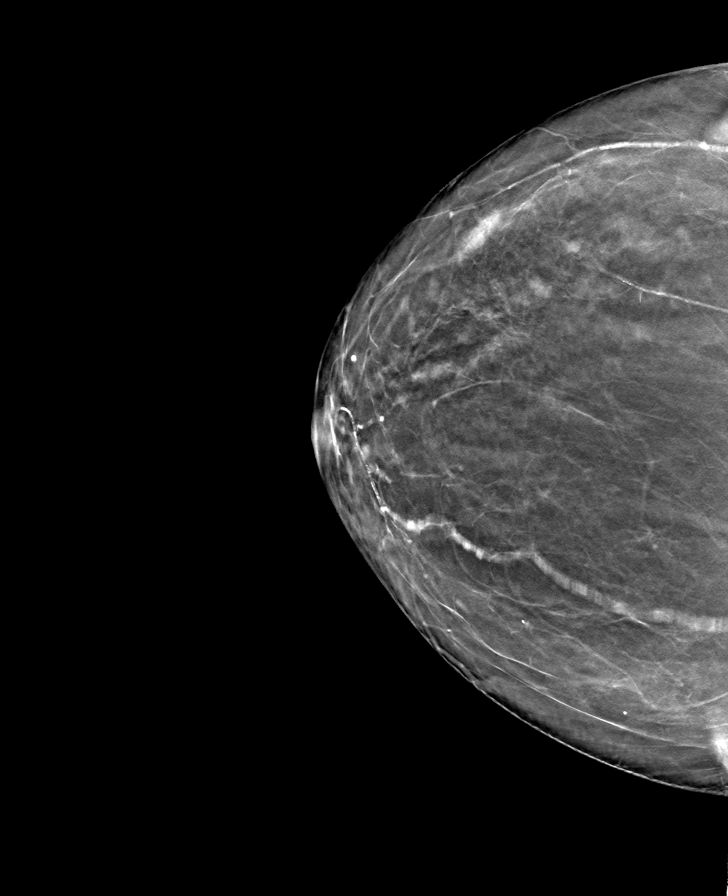

[R MLO tomo · tomo slice 37/73.0]
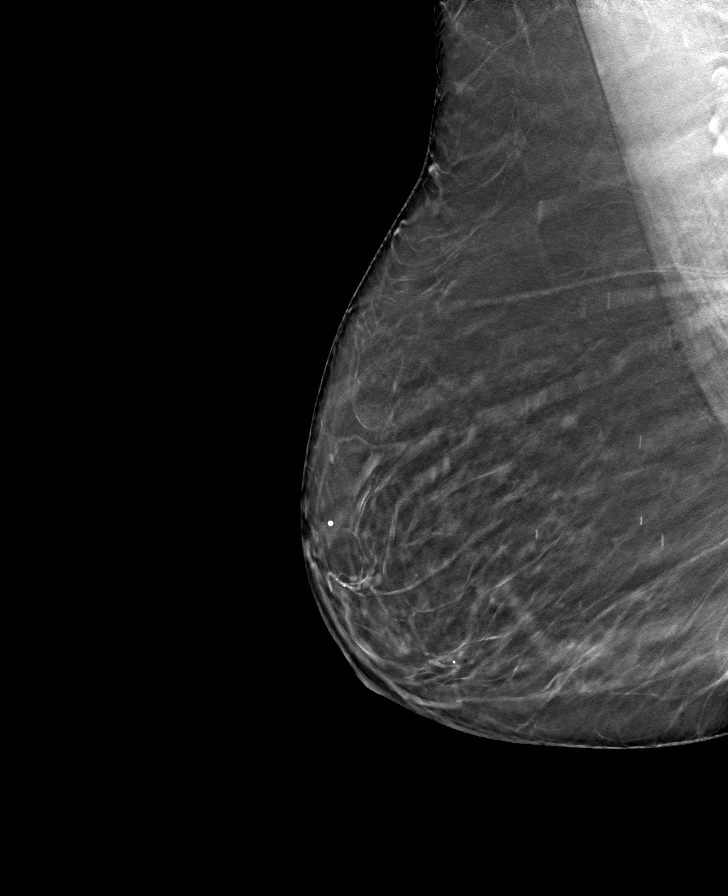

[L CC tomo · tomo slice 34/67.0]
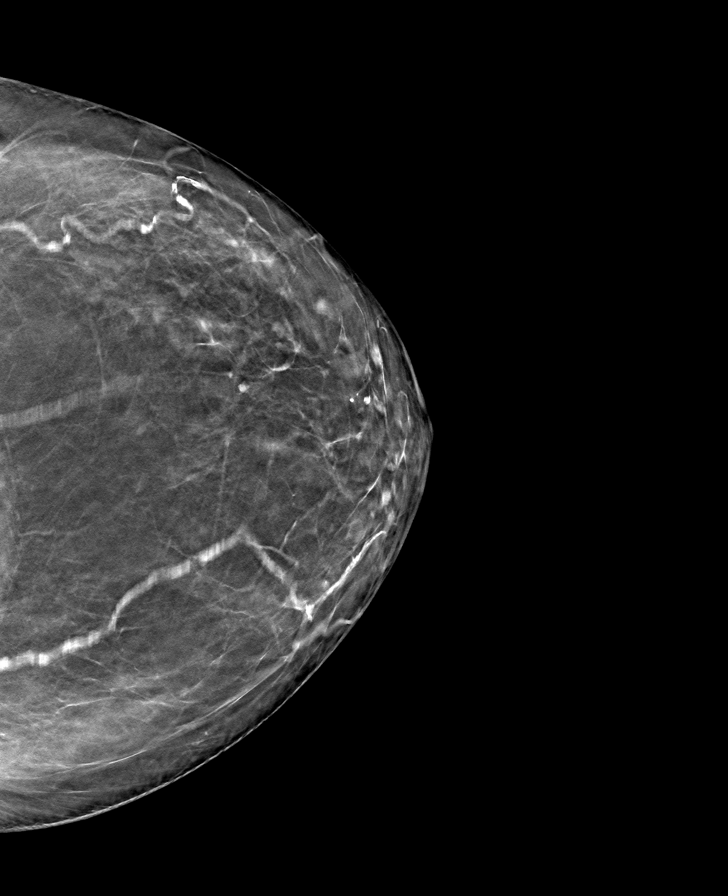

[8 of 24 positions shown; findings below may reference images not displayed]

ACR Breast Density Category b: There are scattered areas of
fibroglandular density.
FINDINGS: There are no findings suspicious for malignancy. Images were
processed with CAD.
IMPRESSION: No mammographic evidence of malignancy. A result letter of this
screening mammogram will be mailed directly to the patient.

RECOMMENDATION:
Screening mammogram in one year. (Code:[id number])

BI-RADS CATEGORY  1: Negative.

## 2022-07-19 ENCOUNTER — Other Ambulatory Visit: Payer: Self-pay | Admitting: Internal Medicine

## 2022-07-19 DIAGNOSIS — Z1231 Encounter for screening mammogram for malignant neoplasm of breast: Secondary | ICD-10-CM

## 2022-09-20 ENCOUNTER — Ambulatory Visit
Admission: RE | Admit: 2022-09-20 | Discharge: 2022-09-20 | Disposition: A | Discharge status: Home / Self Care | Payer: Medicare Other | Source: Ambulatory Visit | Attending: Internal Medicine | Admitting: Internal Medicine

## 2022-09-20 DIAGNOSIS — Z1231 Encounter for screening mammogram for malignant neoplasm of breast: Secondary | ICD-10-CM

## 2024-09-20 ENCOUNTER — Other Ambulatory Visit: Payer: Self-pay | Admitting: Internal Medicine

## 2024-09-20 DIAGNOSIS — Z1231 Encounter for screening mammogram for malignant neoplasm of breast: Secondary | ICD-10-CM

## 2024-11-13 ENCOUNTER — Ambulatory Visit
